# Patient Record
Sex: Male | Born: 1995 | Race: Black or African American | Hispanic: No | Marital: Single | Smoking: Former smoker
Health system: Southern US, Community
[De-identification: ages and names within clinical notes are randomized; demographics above are authoritative.]

## PROBLEM LIST (undated history)

## (undated) DIAGNOSIS — T7840XA Allergy, unspecified, initial encounter: Secondary | ICD-10-CM

## (undated) HISTORY — DX: Allergy, unspecified, initial encounter: T78.40XA

---

## 2021-03-21 ENCOUNTER — Other Ambulatory Visit: Payer: Self-pay

## 2021-03-21 ENCOUNTER — Emergency Department (HOSPITAL_COMMUNITY)
Admission: EM | Admit: 2021-03-21 | Discharge: 2021-03-22 | Disposition: A | Discharge status: Home / Self Care | Payer: BLUE CROSS/BLUE SHIELD | Attending: Emergency Medicine | Admitting: Emergency Medicine

## 2021-03-21 ENCOUNTER — Emergency Department (HOSPITAL_COMMUNITY): Payer: BLUE CROSS/BLUE SHIELD

## 2021-03-21 DIAGNOSIS — Y92410 Unspecified street and highway as the place of occurrence of the external cause: Secondary | ICD-10-CM | POA: Insufficient documentation

## 2021-03-21 DIAGNOSIS — M545 Low back pain, unspecified: Secondary | ICD-10-CM | POA: Insufficient documentation

## 2021-03-21 DIAGNOSIS — M25512 Pain in left shoulder: Secondary | ICD-10-CM | POA: Insufficient documentation
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
## 2021-03-22 ENCOUNTER — Encounter (HOSPITAL_COMMUNITY): Payer: Self-pay | Admitting: Student

## 2021-03-22 MED ORDER — NAPROXEN 500 MG PO TABS: 500.0000 mg | ORAL_TABLET | Freq: Two times a day (BID) | ORAL | 0 refills | Status: DC | PRN

## 2021-03-22 MED ORDER — METHOCARBAMOL 500 MG PO TABS: 500.0000 mg | ORAL_TABLET | Freq: Three times a day (TID) | ORAL | 0 refills | Status: DC | PRN
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
## 2021-04-11 ENCOUNTER — Ambulatory Visit
Admission: EM | Admit: 2021-04-11 | Discharge: 2021-04-11 | Disposition: A | Discharge status: Home / Self Care | Payer: BLUE CROSS/BLUE SHIELD | Attending: Student | Admitting: Student

## 2021-04-11 ENCOUNTER — Encounter: Payer: Self-pay | Admitting: *Deleted

## 2021-04-11 ENCOUNTER — Other Ambulatory Visit: Payer: Self-pay

## 2021-04-11 DIAGNOSIS — S39012D Strain of muscle, fascia and tendon of lower back, subsequent encounter: Secondary | ICD-10-CM | POA: Diagnosis not present

## 2021-04-11 MED ORDER — PREDNISONE 20 MG PO TABS: 40.0000 mg | ORAL_TABLET | Freq: Every day | ORAL | 0 refills | Status: AC
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
## 2021-04-11 NOTE — Discharge Instructions (Addendum)
-  Prednisone, 2 pills taken at the same time for 5 days in a row.  Try taking this earlier in the day as it can give you energy.  Avoid ibuprofen while taking this medication as the combination can cause stomach upset and even gastrointestinal bleeding. -You can continue the Robaxin (methocarbamol) up to 3 times daily for muscle spasms and pain.  This can make you drowsy, so take at bedtime. -You can try Tylenol and heating pad for additional relief. -Limit heavy lifting and prolonged standing and walking.  Work note provided. -If your symptoms persist, follow-up with [facility].  You can schedule an appointment with them online or call them, but you can also walk into the orthopedic urgent care on Monday through Friday.

## 2021-05-30 DIAGNOSIS — Z20828 Contact with and (suspected) exposure to other viral communicable diseases: Secondary | ICD-10-CM | POA: Diagnosis not present

## 2021-06-20 DIAGNOSIS — Z20828 Contact with and (suspected) exposure to other viral communicable diseases: Secondary | ICD-10-CM | POA: Diagnosis not present
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
## 2022-05-23 DIAGNOSIS — Z202 Contact with and (suspected) exposure to infections with a predominantly sexual mode of transmission: Secondary | ICD-10-CM | POA: Diagnosis not present

## 2022-10-20 ENCOUNTER — Ambulatory Visit
Admission: EM | Admit: 2022-10-20 | Discharge: 2022-10-20 | Disposition: A | Discharge status: Home / Self Care | Payer: BLUE CROSS/BLUE SHIELD | Attending: Urgent Care | Admitting: Urgent Care

## 2022-10-20 DIAGNOSIS — J018 Other acute sinusitis: Secondary | ICD-10-CM | POA: Insufficient documentation

## 2022-10-20 DIAGNOSIS — Z113 Encounter for screening for infections with a predominantly sexual mode of transmission: Secondary | ICD-10-CM | POA: Diagnosis not present

## 2022-10-20 MED ORDER — AMOXICILLIN 875 MG PO TABS: 875.0000 mg | ORAL_TABLET | Freq: Two times a day (BID) | ORAL | 0 refills | Status: DC

## 2022-10-20 MED ORDER — CETIRIZINE HCL 10 MG PO TABS: 10.0000 mg | ORAL_TABLET | Freq: Every day | ORAL | 0 refills | Status: AC

## 2022-10-20 MED ORDER — PROMETHAZINE-DM 6.25-15 MG/5ML PO SYRP: 2.5000 mL | ORAL_SOLUTION | Freq: Three times a day (TID) | ORAL | 0 refills | Status: DC | PRN

## 2022-10-20 MED ORDER — PSEUDOEPHEDRINE HCL 30 MG PO TABS: 30.0000 mg | ORAL_TABLET | Freq: Three times a day (TID) | ORAL | 0 refills | Status: DC | PRN

## 2022-10-20 NOTE — ED Triage Notes (Signed)
Pt presents with complaints of cough and congestion since Saturday. Reports being around others sick while on a cruise last week. Pt also endorses headache. Pt also reports tightness in his right jaw with movement and diarrhea.

## 2022-10-20 NOTE — ED Provider Notes (Signed)
[facility]  Note:  This document was prepared using Conservation officer, historic buildings and may include unintentional dictation errors.  MRN: [id number] DOB: 1995-11-30  Subjective:   Benjamin Monroe is a 26 y.o. male presenting for 1 week history of persistent and worsening sinus congestion, coughing, postnasal drainage, subjective fever and general malaise and fatigue.  Would like STI testing.  Has 1 male partner, has unprotected sex. Denies dysuria, hematuria, urinary frequency, penile discharge, penile swelling, testicular pain, testicular swelling, anal pain, groin pain.  No history of asthma, no smoking or vaping, marijuana use.  No current facility-administered medications for this encounter.  Current Outpatient Medications:    methocarbamol (ROBAXIN) 500 MG tablet, Take 1 tablet (500 mg total) by mouth every 8 (eight) hours as needed for muscle spasms., Disp: 15 tablet, Rfl: 0   No Known Allergies  History reviewed. No pertinent past medical history.   History reviewed. No pertinent surgical history.  Family History  Problem Relation Age of Onset   Healthy Mother    Healthy Father     Social History   Tobacco Use   Smoking status: Former    Types: Cigarettes  Building services engineer Use: Former  Substance Use Topics   Alcohol use: Yes    Comment: occasionally   Drug use: Never    ROS   Objective:   Vitals: BP 126/83   Pulse 79   Temp 99.5 F (37.5 C)   Resp 18   SpO2 98%   Physical Exam Constitutional:      General: He is not in acute distress.    Appearance: Normal appearance. He is well-developed and normal weight. He is not ill-appearing, toxic-appearing or diaphoretic.  HENT:     Head: Normocephalic and atraumatic.     Right Ear: Tympanic membrane, ear canal and external ear normal. No drainage, swelling or tenderness. No middle ear effusion. There is no impacted cerumen. Tympanic membrane is not erythematous or bulging.      Left Ear: Tympanic membrane, ear canal and external ear normal. No drainage, swelling or tenderness.  No middle ear effusion. There is no impacted cerumen. Tympanic membrane is not erythematous or bulging.     Nose: Nose normal. No congestion or rhinorrhea.     Mouth/Throat:     Mouth: Mucous membranes are moist.     Pharynx: No oropharyngeal exudate or posterior oropharyngeal erythema.  Eyes:     General: No scleral icterus.       Right eye: No discharge.        Left eye: No discharge.     Extraocular Movements: Extraocular movements intact.     Conjunctiva/sclera: Conjunctivae normal.  Cardiovascular:     Rate and Rhythm: Normal rate and regular rhythm.     Heart sounds: Normal heart sounds. No murmur heard.    No friction rub. No gallop.  Pulmonary:     Effort: Pulmonary effort is normal. No respiratory distress.     Breath sounds: Normal breath sounds. No stridor. No wheezing, rhonchi or rales.  Musculoskeletal:     Cervical back: Normal range of motion and neck supple. No rigidity. No muscular tenderness.  Skin:    General: Skin is warm and dry.  Neurological:     General: No focal deficit present.     Mental Status: He is alert and oriented to person, place, and time.  Psychiatric:        Mood and Affect: Mood normal.  Behavior: Behavior normal.        Thought Content: Thought content normal.      Assessment and Plan :   PDMP not reviewed this encounter.  1. Other acute sinusitis, recurrence not specified   2. Screen for STD (sexually transmitted disease)     Deferred imaging given clear cardiopulmonary exam, hemodynamically stable vital signs. Will start empiric treatment for sinusitis with amoxicillin.  Recommended supportive care otherwise including the use of oral antihistamine, decongestant.  STI testing pending, will treat as appropriate otherwise.  Counseled patient on potential for adverse effects with medications prescribed/recommended today, ER and  return-to-clinic precautions discussed, patient verbalized understanding.    Wallis Bamberg, [place] 10/20/22 1129

## 2022-10-21 LAB — RPR: RPR Ser Ql: NONREACTIVE

## 2022-10-21 LAB — HIV ANTIBODY (ROUTINE TESTING W REFLEX): HIV Screen 4th Generation wRfx: NONREACTIVE

## 2022-10-24 LAB — CYTOLOGY, (ORAL, ANAL, URETHRAL) ANCILLARY ONLY
Chlamydia: NEGATIVE
Comment: NEGATIVE
Comment: NEGATIVE
Comment: NORMAL
Neisseria Gonorrhea: NEGATIVE
Trichomonas: NEGATIVE

## 2023-04-06 ENCOUNTER — Ambulatory Visit (INDEPENDENT_AMBULATORY_CARE_PROVIDER_SITE_OTHER): Payer: BC Managed Care – PPO | Admitting: Nurse Practitioner

## 2023-04-06 ENCOUNTER — Encounter: Payer: Self-pay | Admitting: Nurse Practitioner

## 2023-04-06 VITALS — BP 127/66 | HR 73 | Temp 97.7°F | Ht 64.0 in | Wt 135.4 lb

## 2023-04-06 DIAGNOSIS — Z13228 Encounter for screening for other metabolic disorders: Secondary | ICD-10-CM

## 2023-04-06 DIAGNOSIS — Z130 Encounter for screening for diseases of the blood and blood-forming organs and certain disorders involving the immune mechanism: Secondary | ICD-10-CM | POA: Insufficient documentation

## 2023-04-06 DIAGNOSIS — Z1329 Encounter for screening for other suspected endocrine disorder: Secondary | ICD-10-CM

## 2023-04-06 DIAGNOSIS — Z0000 Encounter for general adult medical examination without abnormal findings: Secondary | ICD-10-CM | POA: Diagnosis not present

## 2023-04-06 DIAGNOSIS — Z1321 Encounter for screening for nutritional disorder: Secondary | ICD-10-CM

## 2023-04-06 NOTE — Assessment & Plan Note (Signed)
Annual exam as documented.  Counseling done include healthy lifestyle involving committing to 150 minutes of exercise per week, heart healthy diet, and attaining healthy weight. The importance of adequate sleep also discussed.  Regular use of seat belt and home safety were also discussed .  Immunization and cancer screening  needs are specifically addressed at this visit.    Routine fasting labs ordered Due for Tdap vaccine patient declined vaccines today he was encouraged to consider getting the vaccine.

## 2023-04-06 NOTE — Progress Notes (Signed)
Complete physical exam  Patient: Benjamin Monroe   DOB: 22-Nov-1995   27 y.o. Male  MRN: [id number]  Subjective:    Chief Complaint  Patient presents with   Establish Care    Physical     Benjamin Monroe is a 27 y.o. male who presents today for a complete physical exam and to establish care.  Has no significant chronic medical condition He reports consuming a general diet. The patient does not participate in regular exercise at present. He generally feels well. He reports sleeping well. He does not have additional problems to discuss today.   No recent PCP  Most recent fall risk assessment:    04/06/2023    1:15 PM  Fall Risk   Falls in the past year? 0  Number falls in past yr: 0  Injury with Fall? 0  Risk for fall due to : No Fall Risks  Follow up Falls evaluation completed     Most recent depression screenings:    04/06/2023    1:15 PM  PHQ 2/9 Scores  PHQ - 2 Score 0  PHQ- 9 Score 0        Patient Care Team: Donell Beers, FNP as PCP - General (Nurse Practitioner)   Outpatient Medications Prior to Visit  Medication Sig   cetirizine (ZYRTEC ALLERGY) 10 MG tablet Take 1 tablet (10 mg total) by mouth daily. (Patient not taking: Reported on 04/06/2023)   [DISCONTINUED] amoxicillin (AMOXIL) 875 MG tablet Take 1 tablet (875 mg total) by mouth 2 (two) times daily. (Patient not taking: Reported on 04/06/2023)   [DISCONTINUED] methocarbamol (ROBAXIN) 500 MG tablet Take 1 tablet (500 mg total) by mouth every 8 (eight) hours as needed for muscle spasms. (Patient not taking: Reported on 04/06/2023)   [DISCONTINUED] promethazine-dextromethorphan (PROMETHAZINE-DM) 6.25-15 MG/5ML syrup Take 2.5 mLs by mouth 3 (three) times daily as needed for cough. (Patient not taking: Reported on 04/06/2023)   [DISCONTINUED] pseudoephedrine (SUDAFED) 30 MG tablet Take 1 tablet (30 mg total) by mouth every 8 (eight) hours as needed for congestion. (Patient not taking: Reported on 04/06/2023)    No facility-administered medications prior to visit.    Review of Systems  Constitutional:  Negative for activity change, appetite change, chills, diaphoresis, fatigue, fever and unexpected weight change.  HENT:  Negative for congestion, dental problem, drooling and ear discharge.   Eyes:  Negative for pain, discharge, redness and itching.  Respiratory:  Negative for apnea, cough, choking, chest tightness, shortness of breath and wheezing.   Cardiovascular: Negative.  Negative for chest pain, palpitations and leg swelling.  Gastrointestinal:  Negative for abdominal distention, abdominal pain, anal bleeding, blood in stool, constipation, diarrhea and vomiting.  Endocrine: Negative for polydipsia, polyphagia and polyuria.  Genitourinary:  Negative for difficulty urinating, flank pain, frequency and genital sores.  Musculoskeletal: Negative.  Negative for arthralgias, back pain, gait problem and joint swelling.  Skin:  Negative for color change, pallor and rash.  Neurological:  Negative for dizziness, facial asymmetry, light-headedness, numbness and headaches.  Psychiatric/Behavioral:  Negative for agitation, behavioral problems, confusion, hallucinations, self-injury, sleep disturbance and suicidal ideas.        Objective:     BP 127/66   Pulse 73   Temp 97.7 F (36.5 C)   Ht 5\' 4"  (1.626 m)   Wt 135 lb 6.4 oz (61.4 kg)   SpO2 98%   BMI 23.24 kg/m    Physical Exam Vitals and nursing note reviewed. Exam conducted with a chaperone present.  Constitutional:      General: He is not in acute distress.    Appearance: Normal appearance. He is not ill-appearing, toxic-appearing or diaphoretic.  HENT:     Right Ear: Tympanic membrane, ear canal and external ear normal. There is no impacted cerumen.     Left Ear: Tympanic membrane, ear canal and external ear normal. There is no impacted cerumen.     Nose: Nose normal. No congestion or rhinorrhea.     Mouth/Throat:     Mouth: Mucous  membranes are moist.     Pharynx: Oropharynx is clear. No oropharyngeal exudate or posterior oropharyngeal erythema.  Eyes:     General: No scleral icterus.       Right eye: No discharge.        Left eye: No discharge.     Extraocular Movements: Extraocular movements intact.     Conjunctiva/sclera: Conjunctivae normal.  Neck:     Vascular: No carotid bruit.  Cardiovascular:     Rate and Rhythm: Normal rate and regular rhythm.     Pulses: Normal pulses.     Heart sounds: Normal heart sounds. No murmur heard.    No friction rub. No gallop.  Pulmonary:     Effort: Pulmonary effort is normal. No respiratory distress.     Breath sounds: Normal breath sounds. No stridor. No wheezing, rhonchi or rales.  Chest:     Chest wall: No tenderness.  Abdominal:     General: Bowel sounds are normal. There is no distension.     Palpations: Abdomen is soft. There is no mass.     Tenderness: There is no abdominal tenderness. There is no right CVA tenderness, left CVA tenderness, guarding or rebound.     Hernia: No hernia is present.  Musculoskeletal:        General: No swelling, tenderness, deformity or signs of injury.     Cervical back: Normal range of motion and neck supple. No rigidity or tenderness.     Right lower leg: No edema.     Left lower leg: No edema.  Lymphadenopathy:     Cervical: No cervical adenopathy.  Skin:    General: Skin is warm and dry.     Capillary Refill: Capillary refill takes 2 to 3 seconds.     Coloration: Skin is not jaundiced or pale.     Findings: No bruising, erythema, lesion or rash.  Neurological:     Mental Status: He is alert and oriented to person, place, and time.     Cranial Nerves: No cranial nerve deficit.     Sensory: No sensory deficit.     Motor: No weakness.     Coordination: Coordination normal.     Gait: Gait normal.     Deep Tendon Reflexes: Reflexes normal.  Psychiatric:        Mood and Affect: Mood normal.        Behavior: Behavior normal.         Thought Content: Thought content normal.        Judgment: Judgment normal.     No results found for any visits on 04/06/23.     Assessment & Plan:    Routine Health Maintenance and Physical Exam   There is no immunization history on file for this patient.  Health Maintenance  Topic Date Due   COVID-19 Vaccine (1) Never done   Hepatitis C Screening  Never done   DTaP/Tdap/Td (1 - Tdap) Never done   INFLUENZA VACCINE  05/31/2023   HIV Screening  Completed   HPV VACCINES  Aged Out    Discussed health benefits of physical activity, and encouraged him to engage in regular exercise appropriate for his age and condition.  Problem List Items Addressed This Visit       Other   Annual physical exam - Primary    Annual exam as documented.  Counseling done include healthy lifestyle involving committing to 150 minutes of exercise per week, heart healthy diet, and attaining healthy weight. The importance of adequate sleep also discussed.  Regular use of seat belt and home safety were also discussed .  Immunization and cancer screening  needs are specifically addressed at this visit.    Routine fasting labs ordered Due for Tdap vaccine patient declined vaccines today he was encouraged to consider getting the vaccine.      Screening for endocrine, nutritional, metabolic and immunity disorder   Relevant Orders   CBC with Differential/Platelet   CMP14+EGFR   Hepatitis C antibody   Lipid panel   TSH   Return in about 1 year (around 04/05/2024) for CPE.     Donell Beers, FNP

## 2023-04-06 NOTE — Patient Instructions (Addendum)
Please consider getting your Tdap vaccine   1. Screening for endocrine, nutritional, metabolic and immunity disorder  - CBC with Differential/Platelet - CMP14+EGFR - Hepatitis C antibody - Lipid panel - TSH  2. Annual physical exam    It is important that you exercise regularly at least 30 minutes 5 times a week as tolerated  Think about what you will eat, plan ahead. Choose " clean, green, fresh or frozen" over canned, processed or packaged foods which are more sugary, salty and fatty. 70 to 75% of food eaten should be vegetables and fruit. Three meals at set times with snacks allowed between meals, but they must be fruit or vegetables. Aim to eat over a 12 hour period , example 7 am to 7 pm, and STOP after  your last meal of the day. Drink water,generally about 64 ounces per day, no other drink is as healthy. Fruit juice is best enjoyed in a healthy way, by EATING the fruit.  Thanks for choosing [facility] we consider it a privelige to serve you.

## 2023-04-10 ENCOUNTER — Other Ambulatory Visit: Payer: BC Managed Care – PPO

## 2023-04-10 DIAGNOSIS — Z130 Encounter for screening for diseases of the blood and blood-forming organs and certain disorders involving the immune mechanism: Secondary | ICD-10-CM | POA: Diagnosis not present

## 2023-04-10 DIAGNOSIS — Z13220 Encounter for screening for lipoid disorders: Secondary | ICD-10-CM | POA: Diagnosis not present

## 2023-04-10 DIAGNOSIS — Z1321 Encounter for screening for nutritional disorder: Secondary | ICD-10-CM | POA: Diagnosis not present

## 2023-04-10 DIAGNOSIS — Z1329 Encounter for screening for other suspected endocrine disorder: Secondary | ICD-10-CM | POA: Diagnosis not present

## 2023-04-10 DIAGNOSIS — Z13228 Encounter for screening for other metabolic disorders: Secondary | ICD-10-CM | POA: Diagnosis not present

## 2023-04-11 LAB — CMP14+EGFR
ALT: 18 IU/L (ref 0–44)
AST: 24 IU/L (ref 0–40)
Albumin/Globulin Ratio: 1.6
Albumin: 4.2 g/dL — ABNORMAL LOW (ref 4.3–5.2)
Alkaline Phosphatase: 62 IU/L (ref 44–121)
BUN/Creatinine Ratio: 11 (ref 9–20)
BUN: 16 mg/dL (ref 6–20)
Bilirubin Total: 0.3 mg/dL (ref 0.0–1.2)
CO2: 25 mmol/L (ref 20–29)
Calcium: 9.4 mg/dL (ref 8.7–10.2)
Chloride: 104 mmol/L (ref 96–106)
Creatinine, Ser: 1.44 mg/dL — ABNORMAL HIGH (ref 0.76–1.27)
Globulin, Total: 2.6 g/dL (ref 1.5–4.5)
Glucose: 104 mg/dL — ABNORMAL HIGH (ref 70–99)
Potassium: 4.3 mmol/L (ref 3.5–5.2)
Sodium: 140 mmol/L (ref 134–144)
Total Protein: 6.8 g/dL (ref 6.0–8.5)
eGFR: 68 mL/min/{1.73_m2} (ref >59)

## 2023-04-11 LAB — CBC WITH DIFFERENTIAL/PLATELET
Basophils Absolute: 0 10*3/uL (ref 0.0–0.2)
Basos: 1 %
EOS (ABSOLUTE): 0.1 10*3/uL (ref 0.0–0.4)
Eos: 2 %
Hematocrit: 45.8 % (ref 37.5–51.0)
Hemoglobin: 15.3 g/dL (ref 13.0–17.7)
Immature Grans (Abs): 0 10*3/uL (ref 0.0–0.1)
Immature Granulocytes: 0 %
Lymphocytes Absolute: 1.8 10*3/uL (ref 0.7–3.1)
Lymphs: 51 %
MCH: 28.7 pg (ref 26.6–33.0)
MCHC: 33.4 g/dL (ref 31.5–35.7)
MCV: 86 fL (ref 79–97)
Monocytes Absolute: 0.3 10*3/uL (ref 0.1–0.9)
Monocytes: 7 %
Neutrophils Absolute: 1.4 10*3/uL (ref 1.4–7.0)
Neutrophils: 39 %
Platelets: 142 10*3/uL — ABNORMAL LOW (ref 150–450)
RBC: 5.34 x10E6/uL (ref 4.14–5.80)
RDW: 12.3 % (ref 11.6–15.4)
WBC: 3.5 10*3/uL (ref 3.4–10.8)

## 2023-04-11 LAB — HEPATITIS C ANTIBODY: Hep C Virus Ab: NONREACTIVE

## 2023-04-11 LAB — LIPID PANEL
Chol/HDL Ratio: 3.3 ratio (ref 0.0–5.0)
Cholesterol, Total: 187 mg/dL (ref 100–199)
HDL: 57 mg/dL (ref >39)
LDL Chol Calc (NIH): 117 mg/dL — ABNORMAL HIGH (ref 0–99)
Triglycerides: 68 mg/dL (ref 0–149)
VLDL Cholesterol Cal: 13 mg/dL (ref 5–40)

## 2023-04-11 LAB — TSH: TSH: 2.18 u[IU]/mL (ref 0.450–4.500)

## 2023-06-22 ENCOUNTER — Ambulatory Visit: Payer: Self-pay | Admitting: Nurse Practitioner

## 2024-04-04 ENCOUNTER — Encounter: Payer: Self-pay | Admitting: Nurse Practitioner
